# Patient Record
Sex: Female | Born: 1995
Health system: Southern US, Community
[De-identification: ages and names within clinical notes are randomized; demographics above are authoritative.]

## PROBLEM LIST (undated history)

## (undated) DIAGNOSIS — G43909 Migraine, unspecified, not intractable, without status migrainosus: Secondary | ICD-10-CM

## (undated) DIAGNOSIS — L0293 Carbuncle, unspecified: Secondary | ICD-10-CM

## (undated) DIAGNOSIS — L0292 Furuncle, unspecified: Secondary | ICD-10-CM

## (undated) HISTORY — DX: Carbuncle, unspecified: L02.93

## (undated) HISTORY — PX: OTHER SURGICAL HISTORY: SHX169

## (undated) HISTORY — DX: Migraine, unspecified, not intractable, without status migrainosus: G43.909

## (undated) HISTORY — DX: Furuncle, unspecified: L02.92

---

## 2017-09-12 ENCOUNTER — Other Ambulatory Visit: Payer: Self-pay

## 2017-09-12 ENCOUNTER — Emergency Department (HOSPITAL_BASED_OUTPATIENT_CLINIC_OR_DEPARTMENT_OTHER)
Admission: EM | Admit: 2017-09-12 | Discharge: 2017-09-12 | Disposition: A | Discharge status: Home / Self Care | Payer: Self-pay | Attending: Emergency Medicine | Admitting: Emergency Medicine

## 2017-09-12 ENCOUNTER — Encounter (HOSPITAL_BASED_OUTPATIENT_CLINIC_OR_DEPARTMENT_OTHER): Payer: Self-pay | Admitting: *Deleted

## 2017-09-12 DIAGNOSIS — F17200 Nicotine dependence, unspecified, uncomplicated: Secondary | ICD-10-CM | POA: Insufficient documentation

## 2017-09-12 DIAGNOSIS — L02412 Cutaneous abscess of left axilla: Secondary | ICD-10-CM | POA: Insufficient documentation

## 2017-09-12 MED ORDER — LIDOCAINE-EPINEPHRINE (PF) 2 %-1:200000 IJ SOLN
10.0000 mL | Freq: Once | INTRAMUSCULAR | Status: AC
  Administered 2017-09-12: 10 mL
  Filled 2017-09-12 (×2): qty 10

## 2017-09-12 NOTE — Discharge Instructions (Signed)
Please read and follow all provided instructions.  Your diagnoses today include:  1. Abscess of left axilla    Tests performed today include:  Vital signs. See below for your results today.   Medications prescribed:   None  Take any prescribed medications only as directed.   Home care instructions:   Follow any educational materials contained in this packet  Follow-up instructions: Return to the Emergency Department in 48 hours for a recheck if your symptoms are not significantly improved.  Please follow-up with your primary care provider in the next 1 week for further evaluation of your symptoms.   Return instructions:  Return to the Emergency Department if you have:  Fever  Worsening symptoms  Worsening pain  Worsening swelling  Redness of the skin that moves away from the affected area, especially if it streaks away from the affected area   Any other emergent concerns  Your vital signs today were: BP 133/76    Pulse 96    Temp 98.2 F (36.8 C)    Resp 18    Ht 6\' 1"  (1.854 m)    Wt 117.9 kg (260 lb)    LMP 09/06/2017    SpO2 100%    BMI 34.30 kg/m  If your blood pressure (BP) was elevated above 135/85 this visit, please have this repeated by your doctor within one month. --------------

## 2017-09-12 NOTE — ED Provider Notes (Signed)
[facility] EMERGENCY DEPARTMENT Provider Note   CSN: [id number] Arrival date & time: 09/12/17  1305     History   Chief Complaint Chief Complaint  Patient presents with  . Abscess    HPI Colleen Woods is a 22 y.o. female.  Patient presents with complaint of swelling and pain to her left axilla starting approximately 1 week ago.  Patient has had similar symptoms with previous abscesses which have required incision and drainage.  She denies fever, nausea or vomiting.  She has been applying warm compresses without much improvement.  The area has not been draining.  Pain is worse with palpation and movement.  Nothing makes her symptoms better.     History reviewed. No pertinent past medical history.  There are no active problems to display for this patient.   History reviewed. No pertinent surgical history.   OB History   None      Home Medications    Prior to Admission medications   Not on File    Family History History reviewed. No pertinent family history.  Social History Social History   Tobacco Use  . Smoking status: Current Every Day Smoker    Packs/day: 0.50  . Smokeless tobacco: Never Used  Substance Use Topics  . Alcohol use: Not Currently  . Drug use: Not Currently     Allergies   Patient has no known allergies.   Review of Systems Review of Systems  Constitutional: Negative for fever.  Gastrointestinal: Negative for nausea and vomiting.  Skin: Negative for color change.       Positive for abscess.  Hematological: Negative for adenopathy.     Physical Exam Updated Vital Signs BP 133/76   Pulse 96   Temp 98.2 F (36.8 C)   Resp 18   Ht 6\' 1"  (1.854 m)   Wt 117.9 kg (260 lb)   LMP 09/06/2017   SpO2 100%   BMI 34.30 kg/m   Physical Exam  Constitutional: She appears well-developed and well-nourished.  HENT:  Head: Normocephalic and atraumatic.  Eyes: Conjunctivae are normal.  Neck: Normal range of motion. Neck  supple.  Pulmonary/Chest: No respiratory distress.  Neurological: She is alert.  Skin: Skin is warm and dry.  Approximately 3 cm area of induration to the left axilla without surrounding cellulitis.  Area is tender consistent with subcutaneous abscess.  Psychiatric: She has a normal mood and affect.  Nursing note and vitals reviewed.    ED Treatments / Results  Labs (all labs ordered are listed, but only abnormal results are displayed) Labs Reviewed - No data to display  EKG None  Radiology No results found.  Procedures .Marland KitchenIncision and Drainage Date/Time: 09/12/2017 2:32 PM Performed by: Renne Crigler, PA-C Authorized by: Renne Crigler, PA-C   Consent:    Consent obtained:  Verbal   Consent given by:  Patient   Risks discussed:  Bleeding, pain and incomplete drainage   Alternatives discussed:  No treatment Location:    Type:  Abscess   Size:  3cm Pre-procedure details:    Skin preparation:  Hibiclens Anesthesia (see MAR for exact dosages):    Anesthesia method:  Local infiltration   Local anesthetic:  Lidocaine 2% WITH epi Procedure type:    Complexity:  Simple Procedure details:    Needle aspiration: yes     Needle size:  18 G   Incision types:  Stab incision   Incision depth:  Dermal   Scalpel blade:  11   Wound management:  Probed and deloculated   Drainage:  Bloody and purulent   Drainage amount:  Copious   Wound treatment:  Wound left open   Packing materials:  None Post-procedure details:    Patient tolerance of procedure:  Tolerated well, no immediate complications   (including critical care time)  Medications Ordered in ED Medications  lidocaine-EPINEPHrine (XYLOCAINE W/EPI) 2 %-1:200000 (PF) injection 10 mL (has no administration in time range)     Initial Impression / Assessment and Plan / ED Course  I have reviewed the triage vital signs and the nursing notes.  Pertinent labs & imaging results that were available during my care of the  patient were reviewed by me and considered in my medical decision making (see chart for details).     Patient seen and examined.  Discussed I&D procedure with patient and she agrees to proceed.  Medications ordered.   Vital signs reviewed and are as follows: BP 133/76   Pulse 96   Temp 98.2 F (36.8 C)   Resp 18   Ht 6\' 1"  (1.854 m)   Wt 117.9 kg (260 lb)   LMP 09/06/2017   SpO2 100%   BMI 34.30 kg/m   The patient was urged to return to the Emergency Department urgently with worsening pain, swelling, expanding erythema especially if it streaks away from the affected area, fever, or if they have any other concerns.   The patient was urged to return to the Emergency Department or go to their PCP in 48 hours for wound recheck if the area is not significantly improved.  The patient verbalized understanding and stated agreement with this plan.    Final Clinical Impressions(s) / ED Diagnoses   Final diagnoses:  Abscess of left axilla   Patient with skin abscess amenable to incision and drainage. Abscess was drained with good results. No signs of cellulitis is surrounding skin. Will d/c to home. No antibiotic therapy is indicated.   ED Discharge Orders    None       Renne CriglerGeiple, Rossetta Kama, Cordelia Poche-C 09/12/17 1632    Gwyneth SproutPlunkett, Whitney, MD 09/16/17 [phone]

## 2017-09-12 NOTE — ED Triage Notes (Signed)
Pt c/ abscess to left axillary x 1 week

## 2017-12-03 ENCOUNTER — Emergency Department (INDEPENDENT_AMBULATORY_CARE_PROVIDER_SITE_OTHER)
Admission: EM | Admit: 2017-12-03 | Discharge: 2017-12-03 | Disposition: A | Discharge status: Home / Self Care | Payer: BLUE CROSS/BLUE SHIELD | Source: Home / Self Care | Attending: Family Medicine | Admitting: Family Medicine

## 2017-12-03 DIAGNOSIS — K0889 Other specified disorders of teeth and supporting structures: Secondary | ICD-10-CM | POA: Diagnosis not present

## 2017-12-03 MED ORDER — HYDROCODONE-ACETAMINOPHEN 5-325 MG PO TABS: ORAL_TABLET | ORAL | 0 refills | Status: DC

## 2017-12-03 MED ORDER — CLINDAMYCIN HCL 300 MG PO CAPS: ORAL_CAPSULE | ORAL | 0 refills | Status: DC

## 2017-12-03 NOTE — ED Provider Notes (Signed)
[facility]    CSN: [id number] Arrival date & time: 12/03/17  1720     History   Chief Complaint Chief Complaint  Patient presents with  . Dental Pain    HPI Colleen Woods is a 22 y.o. female.   Patient complains of two week history of intermittent pain in a right lower tooth.  She notes that she has a dental appointment in three days.  She recently finished a course of amoxicillin for a respiratory infection without improvement.  The history is provided by the patient.  Dental Pain  Location:  Lower Lower teeth location:  29/RL 2nd bicuspid Quality:  Aching and constant Severity:  Severe Onset quality:  Gradual Duration:  2 weeks Timing:  Constant Progression:  Worsening Chronicity:  New Context: filling intact   Previous work-up:  Filled cavity Relieved by:  Nothing Worsened by:  Touching and pressure (chewing) Ineffective treatments:  NSAIDs Associated symptoms: no congestion, no difficulty swallowing, no drooling, no facial pain, no facial swelling, no fever, no gum swelling, no headaches, no neck pain, no neck swelling, no oral bleeding, no oral lesions and no trismus     History reviewed. No pertinent past medical history.  There are no active problems to display for this patient.   History reviewed. No pertinent surgical history.  OB History   None      Home Medications    Prior to Admission medications   Medication Sig Start Date End Date Taking? Authorizing Provider  ibuprofen (ADVIL,MOTRIN) 200 MG tablet Take 200 mg by mouth every 6 (six) hours as needed.   Yes [provider]  clindamycin (CLEOCIN) 300 MG capsule Take one cap by mouth every 8 hours 12/03/17   Lattie Haw, MD  HYDROcodone-acetaminophen (NORCO/VICODIN) 5-325 MG tablet Take one by mouth at bedtime as needed for pain.  May repeat in 4 to 6 hours if needed 12/03/17   Lattie Haw, MD    Family History History reviewed. No pertinent family  history.  Social History Social History   Tobacco Use  . Smoking status: Current Every Day Smoker    Packs/day: 0.10  . Smokeless tobacco: Never Used  Substance Use Topics  . Alcohol use: Not Currently  . Drug use: Not Currently     Allergies   Patient has no known allergies.   Review of Systems Review of Systems  Constitutional: Negative for fever.  HENT: Negative for congestion, drooling, facial swelling and mouth sores.   Musculoskeletal: Negative for neck pain.  Neurological: Negative for headaches.  All other systems reviewed and are negative.    Physical Exam Triage Vital Signs ED Triage Vitals  Enc Vitals Group     BP 12/03/17 1747 127/84     Pulse Rate 12/03/17 1747 87     Resp 12/03/17 1747 16     Temp 12/03/17 1747 98.3 F (36.8 C)     Temp Source 12/03/17 1747 Oral     SpO2 12/03/17 1747 100 %     Weight 12/03/17 1749 278 lb (126.1 kg)     Height 12/03/17 1749 6\' 1"  (1.854 m)     Head Circumference --      Peak Flow --      Pain Score 12/03/17 1749 10     Pain Loc --      Pain Edu? --      Excl. in GC? --    No data found.  Updated Vital Signs BP 127/84 (BP Location:  Right Arm)   Pulse 87   Temp 98.3 F (36.8 C) (Oral)   Resp 16   Ht 6\' 1"  (1.854 m)   Wt 126.1 kg   SpO2 100%   BMI 36.68 kg/m   Visual Acuity Right Eye Distance:   Left Eye Distance:   Bilateral Distance:    Right Eye Near:   Left Eye Near:    Bilateral Near:     Physical Exam  Constitutional: She appears well-developed and well-nourished. No distress.  HENT:  Head: Normocephalic.  Right Ear: External ear normal.  Left Ear: External ear normal.  Nose: Nose normal.  Mouth/Throat: Uvula is midline, oropharynx is clear and moist and mucous membranes are normal. No trismus in the jaw.    There is tenderness to tap over tooth #29 in right mandible.  Surrounding teeth also mildly tender as well as surrounding gingiva, but no swelling or fluctuance.  Eyes: Pupils are  equal, round, and reactive to light. Conjunctivae are normal.  Neck: Neck supple.  Cardiovascular: Normal rate.  Pulmonary/Chest: Effort normal.  Lymphadenopathy:    She has no cervical adenopathy.  Neurological: She is alert.  Skin: Skin is warm and dry.  Nursing note and vitals reviewed.    UC Treatments / Results  Labs (all labs ordered are listed, but only abnormal results are displayed) Labs Reviewed - No data to display  EKG None  Radiology No results found.  Procedures Procedures (including critical care time)  Medications Ordered in UC Medications - No data to display  Initial Impression / Assessment and Plan / UC Course  I have reviewed the triage vital signs and the nursing notes.  Pertinent labs & imaging results that were available during my care of the patient were reviewed by me and considered in my medical decision making (see chart for details).    Begin Clindamycin. Rx for Lortab at bedtime (#10, no refill). Controlled Substance Prescriptions I have consulted the [place] Controlled Substances Registry for this patient, and feel the risk/benefit ratio today is favorable for proceeding with this prescription for a controlled substance.   Followup with dentist as scheduled in 3 days.   Final Clinical Impressions(s) / UC Diagnoses   Final diagnoses:  Tooth pain     Discharge Instructions     May take Tylenol daytime for pain    ED Prescriptions    Medication Sig Dispense Auth. Provider   clindamycin (CLEOCIN) 300 MG capsule Take one cap by mouth every 8 hours 21 capsule Lattie Haw, MD   HYDROcodone-acetaminophen (NORCO/VICODIN) 5-325 MG tablet Take one by mouth at bedtime as needed for pain.  May repeat in 4 to 6 hours if needed 10 tablet Cathren Harsh Tera Mater, MD        Lattie Haw, MD 12/04/17 [phone]

## 2017-12-03 NOTE — Discharge Instructions (Addendum)
May take Tylenol daytime for pain. 

## 2017-12-03 NOTE — ED Triage Notes (Signed)
Colleen Woods complains of tooth pain off and on for the last couple weeks. The pain was worse last night, about a 10/10.

## 2018-04-24 ENCOUNTER — Encounter (HOSPITAL_BASED_OUTPATIENT_CLINIC_OR_DEPARTMENT_OTHER): Payer: Self-pay | Admitting: Emergency Medicine

## 2018-04-24 ENCOUNTER — Emergency Department (HOSPITAL_BASED_OUTPATIENT_CLINIC_OR_DEPARTMENT_OTHER)
Admission: EM | Admit: 2018-04-24 | Discharge: 2018-04-24 | Disposition: A | Discharge status: Home / Self Care | Payer: BLUE CROSS/BLUE SHIELD | Attending: Emergency Medicine | Admitting: Emergency Medicine

## 2018-04-24 ENCOUNTER — Emergency Department (HOSPITAL_BASED_OUTPATIENT_CLINIC_OR_DEPARTMENT_OTHER): Payer: BLUE CROSS/BLUE SHIELD

## 2018-04-24 ENCOUNTER — Other Ambulatory Visit: Payer: Self-pay

## 2018-04-24 DIAGNOSIS — B9789 Other viral agents as the cause of diseases classified elsewhere: Secondary | ICD-10-CM

## 2018-04-24 DIAGNOSIS — Z79899 Other long term (current) drug therapy: Secondary | ICD-10-CM | POA: Insufficient documentation

## 2018-04-24 DIAGNOSIS — L720 Epidermal cyst: Secondary | ICD-10-CM | POA: Diagnosis not present

## 2018-04-24 DIAGNOSIS — J069 Acute upper respiratory infection, unspecified: Secondary | ICD-10-CM | POA: Insufficient documentation

## 2018-04-24 DIAGNOSIS — F17210 Nicotine dependence, cigarettes, uncomplicated: Secondary | ICD-10-CM | POA: Insufficient documentation

## 2018-04-24 LAB — INFLUENZA PANEL BY PCR (TYPE A & B)
INFLBPCR: NEGATIVE
Influenza A By PCR: NEGATIVE

## 2018-04-24 MED ORDER — LIDOCAINE-EPINEPHRINE (PF) 2 %-1:200000 IJ SOLN
10.0000 mL | Freq: Once | INTRAMUSCULAR | Status: AC
  Administered 2018-04-24: 10 mL
  Filled 2018-04-24 (×2): qty 10

## 2018-04-24 NOTE — ED Triage Notes (Signed)
Reports cough, congestion, and generalized body aches since Thursday.

## 2018-04-24 NOTE — ED Provider Notes (Signed)
[facility] EMERGENCY DEPARTMENT Provider Note   CSN: [id number] Arrival date & time: 04/24/18  1423    History   Chief Complaint Chief Complaint  Patient presents with  . Cough    HPI Cedria Stilling is a 23 y.o. female.     HPI Patient is a 23 year old female who presents the emergency department with generalized aches cough congestion with low-grade fever at home.  Symptoms are mild to moderate in severity.  No sinus tenderness.  No dysuria or urinary frequency.  Denies nausea vomiting diarrhea.  Symptoms are mild to moderate in severity.  Patient also with upper back epidermoid cyst present for many years, now with ongoing discomfort.    History reviewed. No pertinent past medical history.  There are no active problems to display for this patient.   History reviewed. No pertinent surgical history.   OB History   No obstetric history on file.      Home Medications    Prior to Admission medications   Medication Sig Start Date End Date Taking? Authorizing Provider  clindamycin (CLEOCIN) 300 MG capsule Take one cap by mouth every 8 hours 12/03/17   Lattie Haw, MD  HYDROcodone-acetaminophen (NORCO/VICODIN) 5-325 MG tablet Take one by mouth at bedtime as needed for pain.  May repeat in 4 to 6 hours if needed 12/03/17   Lattie Haw, MD  ibuprofen (ADVIL,MOTRIN) 200 MG tablet Take 200 mg by mouth every 6 (six) hours as needed.    [provider]    Family History History reviewed. No pertinent family history.  Social History Social History   Tobacco Use  . Smoking status: Current Every Day Smoker    Packs/day: 0.10  . Smokeless tobacco: Never Used  Substance Use Topics  . Alcohol use: Not Currently  . Drug use: Not Currently     Allergies   Patient has no known allergies.   Review of Systems Review of Systems  All other systems reviewed and are negative.    Physical Exam Updated Vital Signs BP 123/70 (BP Location:  Right Arm)   Pulse (!) 103   Temp 98.1 F (36.7 C) (Oral)   Resp 18   Ht 6\' 1"  (1.854 m)   Wt 122.5 kg   LMP 04/10/2018 (Approximate)   SpO2 100%   BMI 35.62 kg/m   Physical Exam Vitals signs and nursing note reviewed.  Constitutional:      General: She is not in acute distress.    Appearance: She is well-developed.  HENT:     Head: Normocephalic and atraumatic.  Neck:     Musculoskeletal: Normal range of motion.  Cardiovascular:     Rate and Rhythm: Normal rate and regular rhythm.     Heart sounds: Normal heart sounds.  Pulmonary:     Effort: Pulmonary effort is normal.     Breath sounds: Normal breath sounds.  Abdominal:     General: There is no distension.     Palpations: Abdomen is soft.     Tenderness: There is no abdominal tenderness.  Musculoskeletal: Normal range of motion.  Skin:    General: Skin is warm and dry.  Neurological:     Mental Status: She is alert and oriented to person, place, and time.  Psychiatric:        Judgment: Judgment normal.      ED Treatments / Results  Labs (all labs ordered are listed, but only abnormal results are displayed) Labs Reviewed  INFLUENZA PANEL BY  PCR (TYPE A & B)    EKG None  Radiology Dg Chest 2 View  Result Date: 04/24/2018 CLINICAL DATA:  Cough and congestion for 3 days. EXAM: CHEST - 2 VIEW COMPARISON:  None. FINDINGS: Lungs clear. Heart size normal. No pneumothorax or pleural fluid. No acute or focal bony abnormality. IMPRESSION: Negative chest. Electronically Signed   By: Drusilla Kanner M.D.   On: 04/24/2018 10:16    Procedures .Marland KitchenIncision and Drainage Date/Time: 04/24/2018 10:56 AM Performed by: Azalia Bilis, MD Authorized by: Azalia Bilis, MD    INCISION AND DRAINAGE Performed by: Azalia Bilis Consent: Verbal consent obtained. Risks and benefits: risks, benefits and alternatives were discussed Time out performed prior to procedure Type: Epidermoid Cyst  Body area: upper back Anesthesia:  local infiltration Incision was made with a scalpel. Local anesthetic: lidocaine 2% with epinephrine Anesthetic total: 7 ml Complexity: complex Blunt dissection to break up loculations Drainage: Keratin Drainage amount: large Packing material: none Patient tolerance: Patient tolerated the procedure well with no immediate complications.     Medications Ordered in ED Medications  lidocaine-EPINEPHrine (XYLOCAINE W/EPI) 2 %-1:200000 (PF) injection 10 mL (10 mLs Infiltration Given by Other 04/24/18 1016)     Initial Impression / Assessment and Plan / ED Course  I have reviewed the triage vital signs and the nursing notes.  Pertinent labs & imaging results that were available during my care of the patient were reviewed by me and considered in my medical decision making (see chart for details).        Well-appearing.  Stable for discharge.  I will follow-up on the patient's influenza and inform her of a positive test.  Stable for discharge from the emergency department.  Incision and drainage of epidermoid cyst with large amount of keratin tear removed  Final Clinical Impressions(s) / ED Diagnoses   Final diagnoses:  None    ED Discharge Orders    None       Azalia Bilis, MD 04/24/18 1057

## 2018-06-07 DIAGNOSIS — L0291 Cutaneous abscess, unspecified: Secondary | ICD-10-CM | POA: Diagnosis not present

## 2018-06-07 DIAGNOSIS — L732 Hidradenitis suppurativa: Secondary | ICD-10-CM | POA: Diagnosis not present

## 2018-08-20 ENCOUNTER — Encounter (HOSPITAL_BASED_OUTPATIENT_CLINIC_OR_DEPARTMENT_OTHER): Payer: Self-pay | Admitting: Emergency Medicine

## 2018-08-20 ENCOUNTER — Emergency Department (HOSPITAL_BASED_OUTPATIENT_CLINIC_OR_DEPARTMENT_OTHER)
Admission: EM | Admit: 2018-08-20 | Discharge: 2018-08-20 | Disposition: A | Discharge status: Home / Self Care | Payer: BC Managed Care – PPO | Attending: Emergency Medicine | Admitting: Emergency Medicine

## 2018-08-20 ENCOUNTER — Other Ambulatory Visit: Payer: Self-pay

## 2018-08-20 DIAGNOSIS — F17200 Nicotine dependence, unspecified, uncomplicated: Secondary | ICD-10-CM | POA: Diagnosis not present

## 2018-08-20 DIAGNOSIS — L02412 Cutaneous abscess of left axilla: Secondary | ICD-10-CM | POA: Insufficient documentation

## 2018-08-20 DIAGNOSIS — R51 Headache: Secondary | ICD-10-CM | POA: Insufficient documentation

## 2018-08-20 DIAGNOSIS — G44209 Tension-type headache, unspecified, not intractable: Secondary | ICD-10-CM

## 2018-08-20 DIAGNOSIS — L0291 Cutaneous abscess, unspecified: Secondary | ICD-10-CM

## 2018-08-20 MED ORDER — LIDOCAINE-EPINEPHRINE (PF) 2 %-1:200000 IJ SOLN
10.0000 mL | Freq: Once | INTRAMUSCULAR | Status: AC
  Administered 2018-08-20: 10 mL
  Filled 2018-08-20 (×2): qty 10

## 2018-08-20 MED ORDER — KETOROLAC TROMETHAMINE 60 MG/2ML IM SOLN
15.0000 mg | Freq: Once | INTRAMUSCULAR | Status: AC
  Administered 2018-08-20: 15 mg via INTRAMUSCULAR
  Filled 2018-08-20: qty 2

## 2018-08-20 MED ORDER — SULFAMETHOXAZOLE-TRIMETHOPRIM 800-160 MG PO TABS: 1.0000 | ORAL_TABLET | Freq: Two times a day (BID) | ORAL | 0 refills | Status: AC

## 2018-08-20 MED ORDER — SULFAMETHOXAZOLE-TRIMETHOPRIM 800-160 MG PO TABS: 1.0000 | ORAL_TABLET | Freq: Two times a day (BID) | ORAL | 0 refills | Status: DC

## 2018-08-20 NOTE — ED Notes (Signed)
Pt verbalized understanding of dc instructions.

## 2018-08-20 NOTE — ED Triage Notes (Signed)
Abscess to L axilla and headache x 1 week.

## 2018-08-20 NOTE — ED Provider Notes (Signed)
[facility] EMERGENCY DEPARTMENT Provider Note   CSN: [id number] Arrival date & time: 08/20/18  1426     History   Chief Complaint Chief Complaint  Patient presents with  . Abscess  . Headache    HPI Colleen Woods is a 23 y.o. female.     Patient presents to the ED with complaint of left axillary abscess ongoing for several days.  She has had a history of the same.  She has had multiple incision and drainages in the past.  She denies any fever, chest pain, shortness of breath.  In addition she has had a frontal headache over the past 1 week.  No vomiting.  No weakness, numbness, or tingling in the arms of the legs.  She is walking normally.  No light or sound sensitivity.  She has had similar headaches in the past.  She has taken ibuprofen without relief.  Headaches waxing and waning.  No preceding head injury or neck pain.     History reviewed. No pertinent past medical history.  There are no active problems to display for this patient.   History reviewed. No pertinent surgical history.   OB History   No obstetric history on file.      Home Medications    Prior to Admission medications   Medication Sig Start Date End Date Taking? Authorizing Provider  clindamycin (CLEOCIN) 300 MG capsule Take one cap by mouth every 8 hours 12/03/17   Lattie HawBeese, Stephen A, MD  HYDROcodone-acetaminophen (NORCO/VICODIN) 5-325 MG tablet Take one by mouth at bedtime as needed for pain.  May repeat in 4 to 6 hours if needed 12/03/17   Lattie HawBeese, Stephen A, MD  ibuprofen (ADVIL,MOTRIN) 200 MG tablet Take 200 mg by mouth every 6 (six) hours as needed.    [provider]  sulfamethoxazole-trimethoprim (BACTRIM DS) 800-160 MG tablet Take 1 tablet by mouth 2 (two) times daily for 7 days. 08/20/18 08/27/18  Renne CriglerGeiple, Ajahni Nay, PA-C    Family History No family history on file.  Social History Social History   Tobacco Use  . Smoking status: Current Every Day Smoker    Packs/day:  0.10  . Smokeless tobacco: Never Used  Substance Use Topics  . Alcohol use: Not Currently  . Drug use: Not Currently     Allergies   Patient has no known allergies.   Review of Systems Review of Systems  Constitutional: Negative for fever.  HENT: Negative for congestion, dental problem, rhinorrhea and sinus pressure.   Eyes: Negative for photophobia, discharge, redness and visual disturbance.  Respiratory: Negative for shortness of breath.   Cardiovascular: Negative for chest pain.  Gastrointestinal: Negative for nausea and vomiting.  Musculoskeletal: Negative for gait problem, neck pain and neck stiffness.  Skin: Positive for color change. Negative for rash.       Positive for abscess.  Neurological: Positive for headaches. Negative for syncope, speech difficulty, weakness, light-headedness and numbness.  Hematological: Negative for adenopathy.  Psychiatric/Behavioral: Negative for confusion.     Physical Exam Updated Vital Signs BP (!) 108/96 (BP Location: Right Arm)   Pulse 95   Temp 98.9 F (37.2 C) (Oral)   Resp 18   Ht 6\' 1"  (1.854 m)   Wt 136.1 kg   LMP 08/15/2018   SpO2 100%   BMI 39.58 kg/m   Physical Exam Vitals signs and nursing note reviewed.  Constitutional:      Appearance: She is well-developed.  HENT:     Head: Normocephalic and atraumatic.  Right Ear: Tympanic membrane, ear canal and external ear normal.     Left Ear: Tympanic membrane, ear canal and external ear normal.     Nose: Nose normal.     Mouth/Throat:     Pharynx: Uvula midline.  Eyes:     General: Lids are normal.        Right eye: No discharge.        Left eye: No discharge.     Extraocular Movements:     Right eye: No nystagmus.     Left eye: No nystagmus.     Conjunctiva/sclera: Conjunctivae normal.     Pupils: Pupils are equal, round, and reactive to light.  Neck:     Musculoskeletal: Normal range of motion and neck supple.  Cardiovascular:     Rate and Rhythm:  Normal rate and regular rhythm.     Heart sounds: Normal heart sounds.  Pulmonary:     Effort: Pulmonary effort is normal.     Breath sounds: Normal breath sounds.  Abdominal:     Palpations: Abdomen is soft.     Tenderness: There is no abdominal tenderness.  Musculoskeletal:     Cervical back: She exhibits normal range of motion, no tenderness and no bony tenderness.  Skin:    General: Skin is warm and dry.     Comments: 3 cm area of induration in the left axillary area, anteriorly, consistent with abscess.  She has 1 or 2 cm of erythema consistent with cellulitis on the chest wall.  Neurological:     Mental Status: She is alert and oriented to person, place, and time.     GCS: GCS eye subscore is 4. GCS verbal subscore is 5. GCS motor subscore is 6.     Cranial Nerves: No cranial nerve deficit.     Sensory: No sensory deficit.     Coordination: Coordination normal.     Gait: Gait normal.     Deep Tendon Reflexes: Reflexes are normal and symmetric.      ED Treatments / Results  Labs (all labs ordered are listed, but only abnormal results are displayed) Labs Reviewed - No data to display  EKG None  Radiology No results found.  Procedures .Marland KitchenIncision and Drainage  Date/Time: 08/20/2018 4:49 PM Performed by: Carlisle Cater, PA-C Authorized by: Carlisle Cater, PA-C   Consent:    Consent obtained:  Verbal   Consent given by:  Patient   Risks discussed:  Pain and bleeding   Alternatives discussed:  No treatment Location:    Type:  Abscess   Size:  3cm   Location: L axilla. Pre-procedure details:    Skin preparation:  Betadine Anesthesia (see MAR for exact dosages):    Anesthesia method:  Local infiltration   Local anesthetic:  Lidocaine 2% WITH epi Procedure type:    Complexity:  Simple Procedure details:    Incision types:  Stab incision   Scalpel blade:  11   Wound management:  Probed and deloculated   Drainage:  Purulent   Drainage amount:  Copious   Wound  treatment:  Drain placed   Packing materials:  1/4 in iodoform gauze Post-procedure details:    Patient tolerance of procedure:  Tolerated well, no immediate complications   (including critical care time)  Medications Ordered in ED Medications  lidocaine-EPINEPHrine (XYLOCAINE W/EPI) 2 %-1:200000 (PF) injection 10 mL (10 mLs Infiltration Given 08/20/18 1615)  ketorolac (TORADOL) injection 15 mg (15 mg Intramuscular Given 08/20/18 1615)  Initial Impression / Assessment and Plan / ED Course  I have reviewed the triage vital signs and the nursing notes.  Pertinent labs & imaging results that were available during my care of the patient were reviewed by me and considered in my medical decision making (see chart for details).        Patient seen and examined.  She agrees to proceed with incision and drainage.  Will give Toradol IM for headache.  Vital signs reviewed and are as follows: BP (!) 108/96 (BP Location: Right Arm)   Pulse 95   Temp 98.9 F (37.2 C) (Oral)   Resp 18   Ht 6\' 1"  (1.854 m)   Wt 136.1 kg   LMP 08/15/2018   SpO2 100%   BMI 39.58 kg/m   4:51 PM Rx Bactrim. The patient was urged to return to the Emergency Department urgently with worsening pain, swelling, expanding erythema especially if it streaks away from the affected area, fever, or if they have any other concerns.   The patient was instructed to remove packing at home in 48 hours and return to the Emergency Department or go to their PCP at that time for a recheck if their symptoms are not entirely resolved.  The patient verbalized understanding and stated agreement with this plan.    Final Clinical Impressions(s) / ED Diagnoses   Final diagnoses:  Abscess  Acute non intractable tension-type headache   Abscess: I&D performed without complicating factors.  Patient will be discharged home on Bactrim.  Headache: Treated with Toradol. Patient without high-risk features of headache including: sudden  onset/thunderclap HA, no similar headache in past, altered mental status, accompanying seizure, headache with exertion, age > 150, history of immunocompromise, neck or shoulder pain, fever, use of anticoagulation, family history of spontaneous SAH, concomitant drug use, toxic exposure.   Patient has a normal complete neurological exam, normal vital signs, normal level of consciousness, no signs of meningismus, is well-appearing/non-toxic appearing, no signs of trauma.   Imaging with CT/MRI not indicated given history and physical exam findings.   No dangerous or life-threatening conditions suspected or identified by history, physical exam, and by work-up. No indications for hospitalization identified.    ED Discharge Orders         Ordered    sulfamethoxazole-trimethoprim (BACTRIM DS) 800-160 MG tablet  2 times daily,   Status:  Discontinued     08/20/18 1643    sulfamethoxazole-trimethoprim (BACTRIM DS) 800-160 MG tablet  2 times daily     08/20/18 1647           Renne CriglerGeiple, Shabazz Mckey, PA-C 08/20/18 1651    Curatolo, Adam, DO 08/20/18 2340

## 2018-08-20 NOTE — Discharge Instructions (Signed)
Please read and follow all provided instructions.  Your diagnoses today include:  1. Abscess   2. Acute non intractable tension-type headache     Tests performed today include:  Vital signs. See below for your results today.   Medications prescribed:   Bactrim (trimethoprim/sulfamethoxazole) - antibiotic  You have been prescribed an antibiotic medicine: take the entire course of medicine even if you are feeling better. Stopping early can cause the antibiotic not to work.  Take any prescribed medications only as directed.   Home care instructions:   Follow any educational materials contained in this packet  Follow-up instructions: Gently pull the packing out in the next 48 hours at home.  Return to the Emergency Department in 48 hours for a recheck if your symptoms are not significantly improved.  Return instructions:  Return to the Emergency Department if you have:  Fever  Worsening symptoms  Worsening pain  Worsening swelling  Redness of the skin that moves away from the affected area, especially if it streaks away from the affected area   Any other emergent concerns  Your vital signs today were: BP (!) 108/96 (BP Location: Right Arm)    Pulse 95    Temp 98.9 F (37.2 C) (Oral)    Resp 18    Ht 6\' 1"  (1.854 m)    Wt 136.1 kg    LMP 08/15/2018    SpO2 100%    BMI 39.58 kg/m  If your blood pressure (BP) was elevated above 135/85 this visit, please have this repeated by your doctor within one month. --------------

## 2018-08-21 MED FILL — SULFAMETHOXAZOLE-TMP DS TAB: 800-160 | 7 days supply | Qty: 14 | Fill #0

## 2018-10-24 ENCOUNTER — Ambulatory Visit: Payer: BC Managed Care – PPO | Admitting: Family

## 2018-10-24 ENCOUNTER — Encounter: Payer: Self-pay | Admitting: Family

## 2018-10-24 ENCOUNTER — Other Ambulatory Visit: Payer: Self-pay

## 2018-10-24 VITALS — BP 118/62 | HR 92 | Temp 98.0°F | Resp 16 | Ht 73.0 in | Wt 298.0 lb

## 2018-10-24 DIAGNOSIS — N62 Hypertrophy of breast: Secondary | ICD-10-CM | POA: Diagnosis not present

## 2018-10-24 DIAGNOSIS — G43909 Migraine, unspecified, not intractable, without status migrainosus: Secondary | ICD-10-CM

## 2018-10-24 DIAGNOSIS — M546 Pain in thoracic spine: Secondary | ICD-10-CM

## 2018-10-24 DIAGNOSIS — G8929 Other chronic pain: Secondary | ICD-10-CM | POA: Diagnosis not present

## 2018-10-24 MED ORDER — NORTRIPTYLINE HCL 10 MG PO CAPS: 10.0000 mg | ORAL_CAPSULE | Freq: Every day | ORAL | 2 refills | Status: AC

## 2018-10-24 MED ORDER — SUMATRIPTAN SUCCINATE 50 MG PO TABS: 50.0000 mg | ORAL_TABLET | ORAL | 2 refills | Status: AC | PRN

## 2018-10-24 MED ORDER — MELOXICAM 7.5 MG PO TABS: 7.5000 mg | ORAL_TABLET | Freq: Every day | ORAL | 0 refills | Status: AC

## 2018-10-24 NOTE — Progress Notes (Signed)
Subjective:    Patient ID: Colleen Woods, female    DOB: March 11, 1995, 23 y.o.   MRN: [id number]  HPI  Headaches- reports that when she was in middle school she would have daily headaches.  She reports that she has HA's daily.  Uses ibuprofen/tylenol but "nothing helps."  Reports + light/noise sensitive. Denies nausea or aura. Reports she had a scan "years ago"  Which was reportedly normal.   Back pain- mid back.  Attributes to heavy breasts. Seems to be worsening as she has been getting older.   Smokes 2-3 cigarettes/day, ("blacks")  Review of Systems  Constitutional: Negative for unexpected weight change.  HENT: Negative for hearing loss and rhinorrhea.   Eyes: Negative for visual disturbance.  Respiratory: Negative for cough and shortness of breath.   Cardiovascular: Negative for leg swelling.  Gastrointestinal: Negative for diarrhea, nausea and vomiting.  Genitourinary: Negative for dysuria and frequency.  Musculoskeletal: Positive for back pain. Negative for arthralgias and myalgias.  Skin: Negative for rash.  Neurological: Positive for headaches.  Hematological: Negative for adenopathy.  Psychiatric/Behavioral:       Denies depression/anxiety   Past Medical History:  Diagnosis Date  . Migraines   . Recurrent boils      Social History   Socioeconomic History  . Marital status: Single    Spouse name: Not on file  . Number of children: Not on file  . Years of education: Not on file  . Highest education level: Not on file  Occupational History  . 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 Financial resource strain: Not hard at all  . Food insecurity    Worry: Never true    Inability: Never true  . Transportation needs    Medical: No    Non-medical: No  Tobacco Use  . Smoking status: Current Every Day Smoker    Packs/day: 0.10    Types: Cigarettes, Cigars  . Smokeless tobacco: Never Used  Substance and Sexual Activity  . Alcohol use: Not  Currently  . Drug use: Not Currently  . Sexual activity: Yes    Partners: Female    Birth control/protection: None  Lifestyle  . Physical activity    Days per week: 0 days    Minutes per session: Not on file  . Stress: Not on file  Relationships  . Social connections    Talks on phone: More than three times a week    Gets together: Never    Attends religious service: Never    Active member of club or organization: No    Attends meetings of clubs or organizations: Never    Relationship status: Not on file  . Intimate partner violence    Fear of current or ex partner: No    Emotionally abused: No    Physically abused: No    Forced sexual activity: No  Other Topics Concern  . 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 incision and drainage     of recurrent boils    Family History  Problem Relation Age of Onset  . Hyperlipidemia Mother   . Hyperlipidemia Maternal Grandmother  No Known Allergies  No current outpatient medications on file prior to visit.   No current facility-administered medications on file prior to visit.     BP 118/62 (BP Location: Right Arm, Patient Position: Sitting, Cuff Size: Large)   Pulse 92   Temp 98 F (36.7 C) (Temporal)   Resp 16   Ht 6\' 1"  (1.854 m)   Wt 298 lb (135.2 kg)   SpO2 100%   BMI 39.32 kg/m       Objective:   Physical Exam Constitutional:      Appearance: She is well-developed.  Eyes:     Extraocular Movements: Extraocular movements intact.     Pupils: Pupils are equal, round, and reactive to light.  Neck:     Musculoskeletal: Neck supple.     Thyroid: No thyromegaly.  Cardiovascular:     Rate and Rhythm: Normal rate and regular rhythm.     Heart  sounds: Normal heart sounds. No murmur.  Pulmonary:     Effort: Pulmonary effort is normal. No respiratory distress.     Breath sounds: Normal breath sounds. No wheezing.  Musculoskeletal:     Cervical back: She exhibits no tenderness.     Thoracic back: She exhibits no tenderness.     Lumbar back: She exhibits no tenderness.  Skin:    General: Skin is warm and dry.  Neurological:     Mental Status: She is alert and oriented to person, place, and time.     Cranial Nerves: Facial asymmetry present.     Motor: No abnormal muscle tone.  Psychiatric:        Behavior: Behavior normal.        Thought Content: Thought content normal.        Judgment: Judgment normal.           Assessment & Plan:  Back pain- trial of meloxicam, recommended back exercises.  Macromastia- requests referral for breast reduction. Referral placed.   Migraines- trial of pamelor and prn imitrex

## 2018-10-24 NOTE — Patient Instructions (Signed)
Please begin nortriptyline once daily at night for migraine prevention. You may use imitrex as needed for migraine.  Begin meloxicam for back pain. Work on weight loss and quitting smoking.  Welcome to [facility]!

## 2018-10-27 ENCOUNTER — Encounter: Payer: Self-pay | Admitting: Family

## 2018-10-27 ENCOUNTER — Ambulatory Visit: Payer: BC Managed Care – PPO | Admitting: Family

## 2018-10-27 ENCOUNTER — Other Ambulatory Visit: Payer: Self-pay

## 2018-10-27 VITALS — BP 115/73 | HR 99 | Temp 98.3°F | Resp 16

## 2018-10-27 DIAGNOSIS — L02213 Cutaneous abscess of chest wall: Secondary | ICD-10-CM | POA: Diagnosis not present

## 2018-10-27 MED ORDER — DOXYCYCLINE HYCLATE 100 MG PO TABS: 100.0000 mg | ORAL_TABLET | Freq: Two times a day (BID) | ORAL | 0 refills | Status: DC

## 2018-10-27 NOTE — Patient Instructions (Signed)
Please begin doxycycline (antibiotic) for skin infection. Call if increased pain/swelling or redness.

## 2018-10-27 NOTE — Progress Notes (Addendum)
Subjective:    Patient ID: Colleen Woods, female    DOB: 09/29/1995, 23 y.o.   MRN: [id number]  HPI  Patient is a 23 yr old female who presents today with chief complaint of abscess of the right breast.  She reports that abscess has been present x 1 week and is tender. Reports similar lesions in the past in her axilla but never on the breast area.   Review of Systems    see HPI  Past Medical History:  Diagnosis Date  . Migraines   . Recurrent boils      Social History   Socioeconomic History  . Marital status: Single    Spouse name: Not on file  . Number of children: Not on file  . Years of education: Not on file  . Highest education level: Not on file  Occupational History  . 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 Financial resource strain: Not hard at all  . Food insecurity    Worry: Never true    Inability: Never true  . Transportation needs    Medical: No    Non-medical: No  Tobacco Use  . Smoking status: Current Every Day Smoker    Packs/day: 0.10    Types: Cigarettes, Cigars  . Smokeless tobacco: Never Used  Substance and Sexual Activity  . Alcohol use: Not Currently  . Drug use: Not Currently  . Sexual activity: Yes    Partners: Female    Birth control/protection: None  Lifestyle  . Physical activity    Days per week: 0 days    Minutes per session: Not on file  . Stress: Not on file  Relationships  . Social connections    Talks on phone: More than three times a week    Gets together: Never    Attends religious service: Never    Active member of club or organization: No    Attends meetings of clubs or organizations: Never    Relationship status: Not on file  . Intimate partner violence    Fear of current or ex partner: No    Emotionally abused: No    Physically abused: No    Forced sexual activity: No  Other Topics Concern  . 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 incision and drainage     of recurrent boils    Family History  Problem Relation Age of Onset  . Hyperlipidemia Mother   . Hyperlipidemia Maternal Grandmother     No Known Allergies  Current Outpatient Medications on File Prior to Visit  Medication Sig Dispense Refill  . meloxicam (MOBIC) 7.5 MG tablet Take 1 tablet (7.5 mg total) by mouth daily. For back pain 14 tablet 0  . nortriptyline (PAMELOR) 10 MG capsule Take 1 capsule (10 mg total) by mouth at bedtime. 30 capsule 2  . SUMAtriptan (IMITREX) 50 MG tablet Take 1 tablet (50 mg total) by mouth every 2 (two) hours as needed for migraine (max 2 tabs/24 hrs). May repeat in 2 hours if headache persists or recurs. 10 tablet 2   No current facility-administered medications on file prior to visit.  BP 115/73 (BP Location: Right Arm, Patient Position: Sitting, Cuff Size: Large)   Pulse 99   Temp 98.3 F (36.8 C) (Temporal)   Resp 16   SpO2 100%    Objective:   Physical Exam Constitutional:      Appearance: Normal appearance. She is obese.  Skin:         Comments: Approximately 2 cm by 1 cm wide area of fluctuance on right breast near sternum with surrounding induration/tenderness and erythema   Neurological:     Mental Status: She is alert.  Psychiatric:        Mood and Affect: Mood normal.        Behavior: Behavior normal.        Thought Content: Thought content normal.           Assessment & Plan:  Skin abscess- Procedure including risks/benefits explained to patient.  Questions were answered. After informed consent was obtained and a time out completed, site was cleansed with betadine. Approximately 1 cc of 1% Lidocaine with epinephrine was infiltrated into abscess and surrounding tissue. I and D  was performed with sterile scalpel and purulent drainage was expressed from the abscess. No indication for packing today.  Pt instructed to contact us if she develops increased pain or redness at the site.  Pt may use tylenol as needed for discomfort today.

## 2018-11-07 ENCOUNTER — Ambulatory Visit: Payer: BC Managed Care – PPO | Admitting: Family

## 2018-11-07 DIAGNOSIS — Z0289 Encounter for other administrative examinations: Secondary | ICD-10-CM

## 2018-12-01 ENCOUNTER — Encounter: Payer: BC Managed Care – PPO | Admitting: Family

## 2018-12-26 ENCOUNTER — Institutional Professional Consult (permissible substitution): Payer: BC Managed Care – PPO | Admitting: Plastic Surgery

## 2018-12-28 ENCOUNTER — Other Ambulatory Visit: Payer: Self-pay

## 2018-12-29 ENCOUNTER — Encounter: Payer: BC Managed Care – PPO | Admitting: Family

## 2018-12-29 ENCOUNTER — Ambulatory Visit (INDEPENDENT_AMBULATORY_CARE_PROVIDER_SITE_OTHER): Payer: BC Managed Care – PPO | Admitting: Family

## 2018-12-29 ENCOUNTER — Encounter: Payer: Self-pay | Admitting: Family

## 2018-12-29 VITALS — BP 118/68 | HR 89 | Temp 97.2°F | Resp 16 | Ht 73.0 in | Wt 294.0 lb

## 2018-12-29 DIAGNOSIS — Z23 Encounter for immunization: Secondary | ICD-10-CM

## 2018-12-29 DIAGNOSIS — Z0000 Encounter for general adult medical examination without abnormal findings: Secondary | ICD-10-CM

## 2018-12-29 NOTE — Patient Instructions (Addendum)
Please complete lab work prior to leaving.  Preventive Care 21-23 Years Old, Female Preventive care refers to visits with your health care provider and lifestyle choices that can promote health and wellness. This includes:  A yearly physical exam. This may also be called an annual well check.  Regular dental visits and eye exams.  Immunizations.  Screening for certain conditions.  Healthy lifestyle choices, such as eating a healthy diet, getting regular exercise, not using drugs or products that contain nicotine and tobacco, and limiting alcohol use. What can I expect for my preventive care visit? Physical exam Your health care provider will check your:  Height and weight. This may be used to calculate body mass index (BMI), which tells if you are at a healthy weight.  Heart rate and blood pressure.  Skin for abnormal spots. Counseling Your health care provider may ask you questions about your:  Alcohol, tobacco, and drug use.  Emotional well-being.  Home and relationship well-being.  Sexual activity.  Eating habits.  Work and work environment.  Method of birth control.  Menstrual cycle.  Pregnancy history. What immunizations do I need?  Influenza (flu) vaccine  This is recommended every year. Tetanus, diphtheria, and pertussis (Tdap) vaccine  You may need a Td booster every 10 years. Varicella (chickenpox) vaccine  You may need this if you have not been vaccinated. Human papillomavirus (HPV) vaccine  If recommended by your health care provider, you may need three doses over 6 months. Measles, mumps, and rubella (MMR) vaccine  You may need at least one dose of MMR. You may also need a second dose. Meningococcal conjugate (MenACWY) vaccine  One dose is recommended if you are age 19-21 years and a first-year college student living in a residence hall, or if you have one of several medical conditions. You may also need additional booster doses. Pneumococcal  conjugate (PCV13) vaccine  You may need this if you have certain conditions and were not previously vaccinated. Pneumococcal polysaccharide (PPSV23) vaccine  You may need one or two doses if you smoke cigarettes or if you have certain conditions. Hepatitis A vaccine  You may need this if you have certain conditions or if you travel or work in places where you may be exposed to hepatitis A. Hepatitis B vaccine  You may need this if you have certain conditions or if you travel or work in places where you may be exposed to hepatitis B. Haemophilus influenzae type b (Hib) vaccine  You may need this if you have certain conditions. You may receive vaccines as individual doses or as more than one vaccine together in one shot (combination vaccines). Talk with your health care provider about the risks and benefits of combination vaccines. What tests do I need?  Blood tests  Lipid and cholesterol levels. These may be checked every 5 years starting at age 20.  Hepatitis C test.  Hepatitis B test. Screening  Diabetes screening. This is done by checking your blood sugar (glucose) after you have not eaten for a while (fasting).  Sexually transmitted disease (STD) testing.  BRCA-related cancer screening. This may be done if you have a family history of breast, ovarian, tubal, or peritoneal cancers.  Pelvic exam and Pap test. This may be done every 3 years starting at age 21. Starting at age 30, this may be done every 5 years if you have a Pap test in combination with an HPV test. Talk with your health care provider about your test results, treatment options,   treatment options, and if necessary, the need for more tests. Follow these instructions at home: Eating and drinking   Eat a diet that includes fresh fruits and vegetables, whole grains, lean protein, and low-fat dairy.  Take vitamin and mineral supplements as recommended by your health care provider.  Do not drink alcohol  if: ? Your health care provider tells you not to drink. ? You are pregnant, may be pregnant, or are planning to become pregnant.  If you drink alcohol: ? Limit how much you have to 0-1 drink a day. ? Be aware of how much alcohol is in your drink. In the [place] one drink equals one 12 oz bottle of beer (355 mL), one 5 oz glass of wine (148 mL), or one 1 oz glass of hard liquor (44 mL). Lifestyle  Take daily care of your teeth and gums.  Stay active. Exercise for at least 30 minutes on 5 or more days each week.  Do not use any products that contain nicotine or tobacco, such as cigarettes, e-cigarettes, and chewing tobacco. If you need help quitting, ask your health care provider.  If you are sexually active, practice safe sex. Use a condom or other form of birth control (contraception) in order to prevent pregnancy and STIs (sexually transmitted infections). If you plan to become pregnant, see your health care provider for a preconception visit. What's next?  Visit your health care provider once a year for a well check visit.  Ask your health care provider how often you should have your eyes and teeth checked.  Stay up to date on all vaccines. This information is not intended to replace advice given to you by your health care provider. Make sure you discuss any questions you have with your health care provider. Document Released: 03/23/2001 Document Revised: 10/06/2017 Document Reviewed: 10/06/2017 Elsevier Patient Education  2020 [employer].

## 2018-12-29 NOTE — Progress Notes (Signed)
Subjective:    Patient ID: Colleen Woods, female    DOB: [birth year], 23 y.o.   MRN: [id number]  HPI   Patient presents today for complete physical.  Immunizations: declines flu shot, unsure of last tetanus Diet:  not eating healthy, drinks water, juice and soda.  Drinks  Exercise: not exercising Works 8-16 hrs/day.  Works 6-7 days a week.  Pap Smear: 10/11/16 Vision: up to date Dental: up to date  Wt Readings from Last 3 Encounters:  12/29/18 294 lb (133.4 kg)  10/24/18 298 lb (135.2 kg)  08/20/18 300 lb (136.1 kg)    Review of Systems  Constitutional: Negative for unexpected weight change.  HENT: Negative for hearing loss.   Eyes: Negative for visual disturbance.  Respiratory: Negative for cough and shortness of breath.   Cardiovascular: Negative for leg swelling.  Gastrointestinal: Negative for blood in stool, constipation and diarrhea.  Endocrine: Negative for polydipsia.  Genitourinary: Positive for frequency. Negative for dysuria, hematuria and menstrual problem.  Musculoskeletal: Negative for arthralgias and myalgias.  Skin: Negative for rash.  Neurological: Positive for headaches (occasional headaches).  Hematological: Negative for adenopathy.  Psychiatric/Behavioral:       Denies depression/anxiety   Past Medical History:  Diagnosis Date  . Migraines   . Recurrent boils      Social History   Socioeconomic History  . Marital status: Single    Spouse name: Not on file  . Number of children: Not on file  . Years of education: Not on file  . Highest education level: Not on file  Occupational History  . 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 Financial resource strain: Not hard at all  . Food insecurity    Worry: Never true    Inability: Never true  . Transportation needs    Medical: No    Non-medical: No  Tobacco Use  . Smoking status: Current Every Day Smoker    Packs/day: 0.10    Types: Cigarettes, Cigars  . Smokeless  tobacco: Never Used  Substance and Sexual Activity  . Alcohol use: Not Currently  . Drug use: Not Currently  . Sexual activity: Yes    Partners: Female    Birth control/protection: None  Lifestyle  . Physical activity    Days per week: 0 days    Minutes per session: Not on file  . Stress: Not on file  Relationships  . Social connections    Talks on phone: More than three times a week    Gets together: Never    Attends religious service: Never    Active member of club or organization: No    Attends meetings of clubs or organizations: Never    Relationship status: Not on file  . Intimate partner violence    Fear of current or ex partner: No    Emotionally abused: No    Physically abused: No    Forced sexual activity: No  Other Topics Concern  . Not on file  Social History Narrative   Moved from [facility] (moved here in 2019)   Works at [employer] (group home facility)   Step mom and younger brother/sister are all local   No pets   Enjoys sleeping, doing hair   Completed high school    Past Surgical History:  Procedure Laterality Date  . incision and drainage     of recurrent boils    Family History  Problem Relation Age of Onset  .  Hyperlipidemia Mother   . Hyperlipidemia Maternal Grandmother     No Known Allergies  Current Outpatient Medications on File Prior to Visit  Medication Sig Dispense Refill  . meloxicam (MOBIC) 7.5 MG tablet Take 1 tablet (7.5 mg total) by mouth daily. For back pain 14 tablet 0  . nortriptyline (PAMELOR) 10 MG capsule Take 1 capsule (10 mg total) by mouth at bedtime. 30 capsule 2  . SUMAtriptan (IMITREX) 50 MG tablet Take 1 tablet (50 mg total) by mouth every 2 (two) hours as needed for migraine (max 2 tabs/24 hrs). May repeat in 2 hours if headache persists or recurs. 10 tablet 2   No current facility-administered medications on file prior to visit.     BP 118/68 (BP Location: Left Arm, Patient Position: Sitting, Cuff Size: Large)    Pulse 89   Temp (!) 97.2 F (36.2 C) (Temporal)   Resp 16   Ht 6\' 1"  (1.854 m)   Wt 294 lb (133.4 kg)   SpO2 99%   BMI 38.79 kg/m       Objective:   Physical Exam  Physical Exam  Constitutional: She is oriented to person, place, and time. She appears well-developed and well-nourished. No distress.  HENT:  Head: Normocephalic and atraumatic.  Right Ear: Tympanic membrane and ear canal normal.  Left Ear: Tympanic membrane and ear canal normal.  Mouth/Throat: Oropharynx is clear and moist.  Eyes: Pupils are equal, round, and reactive to light. No scleral icterus.  Neck: Normal range of motion. No thyromegaly present.  Cardiovascular: Normal rate and regular rhythm.   No murmur heard. Pulmonary/Chest: Effort normal and breath sounds normal. No respiratory distress. He has no wheezes. She has no rales. She exhibits no tenderness.  Abdominal: Soft. Bowel sounds are normal. She exhibits no distension and no mass. There is no tenderness. There is no rebound and no guarding.  Musculoskeletal: She exhibits no edema.  Lymphadenopathy:    She has no cervical adenopathy.  Neurological: She is alert and oriented to person, place, and time. She has normal patellar reflexes. She exhibits normal muscle tone. Coordination normal.  Skin: Skin is warm and dry.  Psychiatric: She has a normal mood and affect. Her behavior is normal. Judgment and thought content normal.  Breast/pelvic: deferred           Assessment & Plan:   Preventative care- discussed healthy diet, exercise and weight loss. Refer to GYN for pap smear.  Immunizations reviewed and up to date. Declines flu shot.           Assessment & Plan:

## 2018-12-30 LAB — BASIC METABOLIC PANEL
BUN: 10 mg/dL (ref 7–25)
CO2: 23 mmol/L (ref 20–32)
Calcium: 9.2 mg/dL (ref 8.6–10.2)
Chloride: 106 mmol/L (ref 98–110)
Creat: 0.73 mg/dL (ref 0.50–1.10)
Glucose, Bld: 95 mg/dL (ref 65–99)
Potassium: 3.8 mmol/L (ref 3.5–5.3)
Sodium: 140 mmol/L (ref 135–146)

## 2018-12-30 LAB — HEPATIC FUNCTION PANEL
AG Ratio: 1.4 (calc) (ref 1.0–2.5)
ALT: 11 U/L (ref 6–29)
AST: 12 U/L (ref 10–30)
Albumin: 4.2 g/dL (ref 3.6–5.1)
Alkaline phosphatase (APISO): 43 U/L (ref 31–125)
Bilirubin, Direct: 0.1 mg/dL (ref 0.0–0.2)
Globulin: 3 g/dL (calc) (ref 1.9–3.7)
Indirect Bilirubin: 0.2 mg/dL (calc) (ref 0.2–1.2)
Total Bilirubin: 0.3 mg/dL (ref 0.2–1.2)
Total Protein: 7.2 g/dL (ref 6.1–8.1)

## 2018-12-30 LAB — CBC WITH DIFFERENTIAL/PLATELET
Absolute Monocytes: 495 cells/uL (ref 200–950)
Basophils Absolute: 31 cells/uL (ref 0–200)
Basophils Relative: 0.6 %
Eosinophils Absolute: 31 cells/uL (ref 15–500)
Eosinophils Relative: 0.6 %
HCT: 38.2 % (ref 35.0–45.0)
Hemoglobin: 12.2 g/dL (ref 11.7–15.5)
Lymphs Abs: 2152 cells/uL (ref 850–3900)
MCH: 27.2 pg (ref 27.0–33.0)
MCHC: 31.9 g/dL — ABNORMAL LOW (ref 32.0–36.0)
MCV: 85.3 fL (ref 80.0–100.0)
MPV: 12.3 fL (ref 7.5–12.5)
Monocytes Relative: 9.7 %
Neutro Abs: 2392 cells/uL (ref 1500–7800)
Neutrophils Relative %: 46.9 %
Platelets: 232 10*3/uL (ref 140–400)
RBC: 4.48 10*6/uL (ref 3.80–5.10)
RDW: 13 % (ref 11.0–15.0)
Total Lymphocyte: 42.2 %
WBC: 5.1 10*3/uL (ref 3.8–10.8)

## 2018-12-30 LAB — LIPID PANEL
Cholesterol: 139 mg/dL (ref <200)
HDL: 39 mg/dL — ABNORMAL LOW (ref >=50)
LDL Cholesterol (Calc): 85 mg/dL (calc)
Non-HDL Cholesterol (Calc): 100 mg/dL (calc) (ref <130)
Total CHOL/HDL Ratio: 3.6 (calc) (ref <5.0)
Triglycerides: 68 mg/dL (ref <150)

## 2018-12-30 LAB — TSH: TSH: 0.76 mIU/L

## 2019-01-24 ENCOUNTER — Encounter: Payer: Self-pay | Admitting: Family

## 2019-01-30 ENCOUNTER — Institutional Professional Consult (permissible substitution): Payer: BC Managed Care – PPO | Admitting: Plastic Surgery

## 2019-06-29 ENCOUNTER — Ambulatory Visit: Payer: Self-pay | Admitting: Family

## 2019-06-29 DIAGNOSIS — Z0289 Encounter for other administrative examinations: Secondary | ICD-10-CM

## 2021-03-07 IMAGING — CR CHEST - 2 VIEW
2 series · 2 of 2 positions shown · non-contrast
Comparison: None.

CLINICAL DATA: Cough and congestion for 3 days.

EXAM:
CHEST - 2 VIEW

[w chest pa]
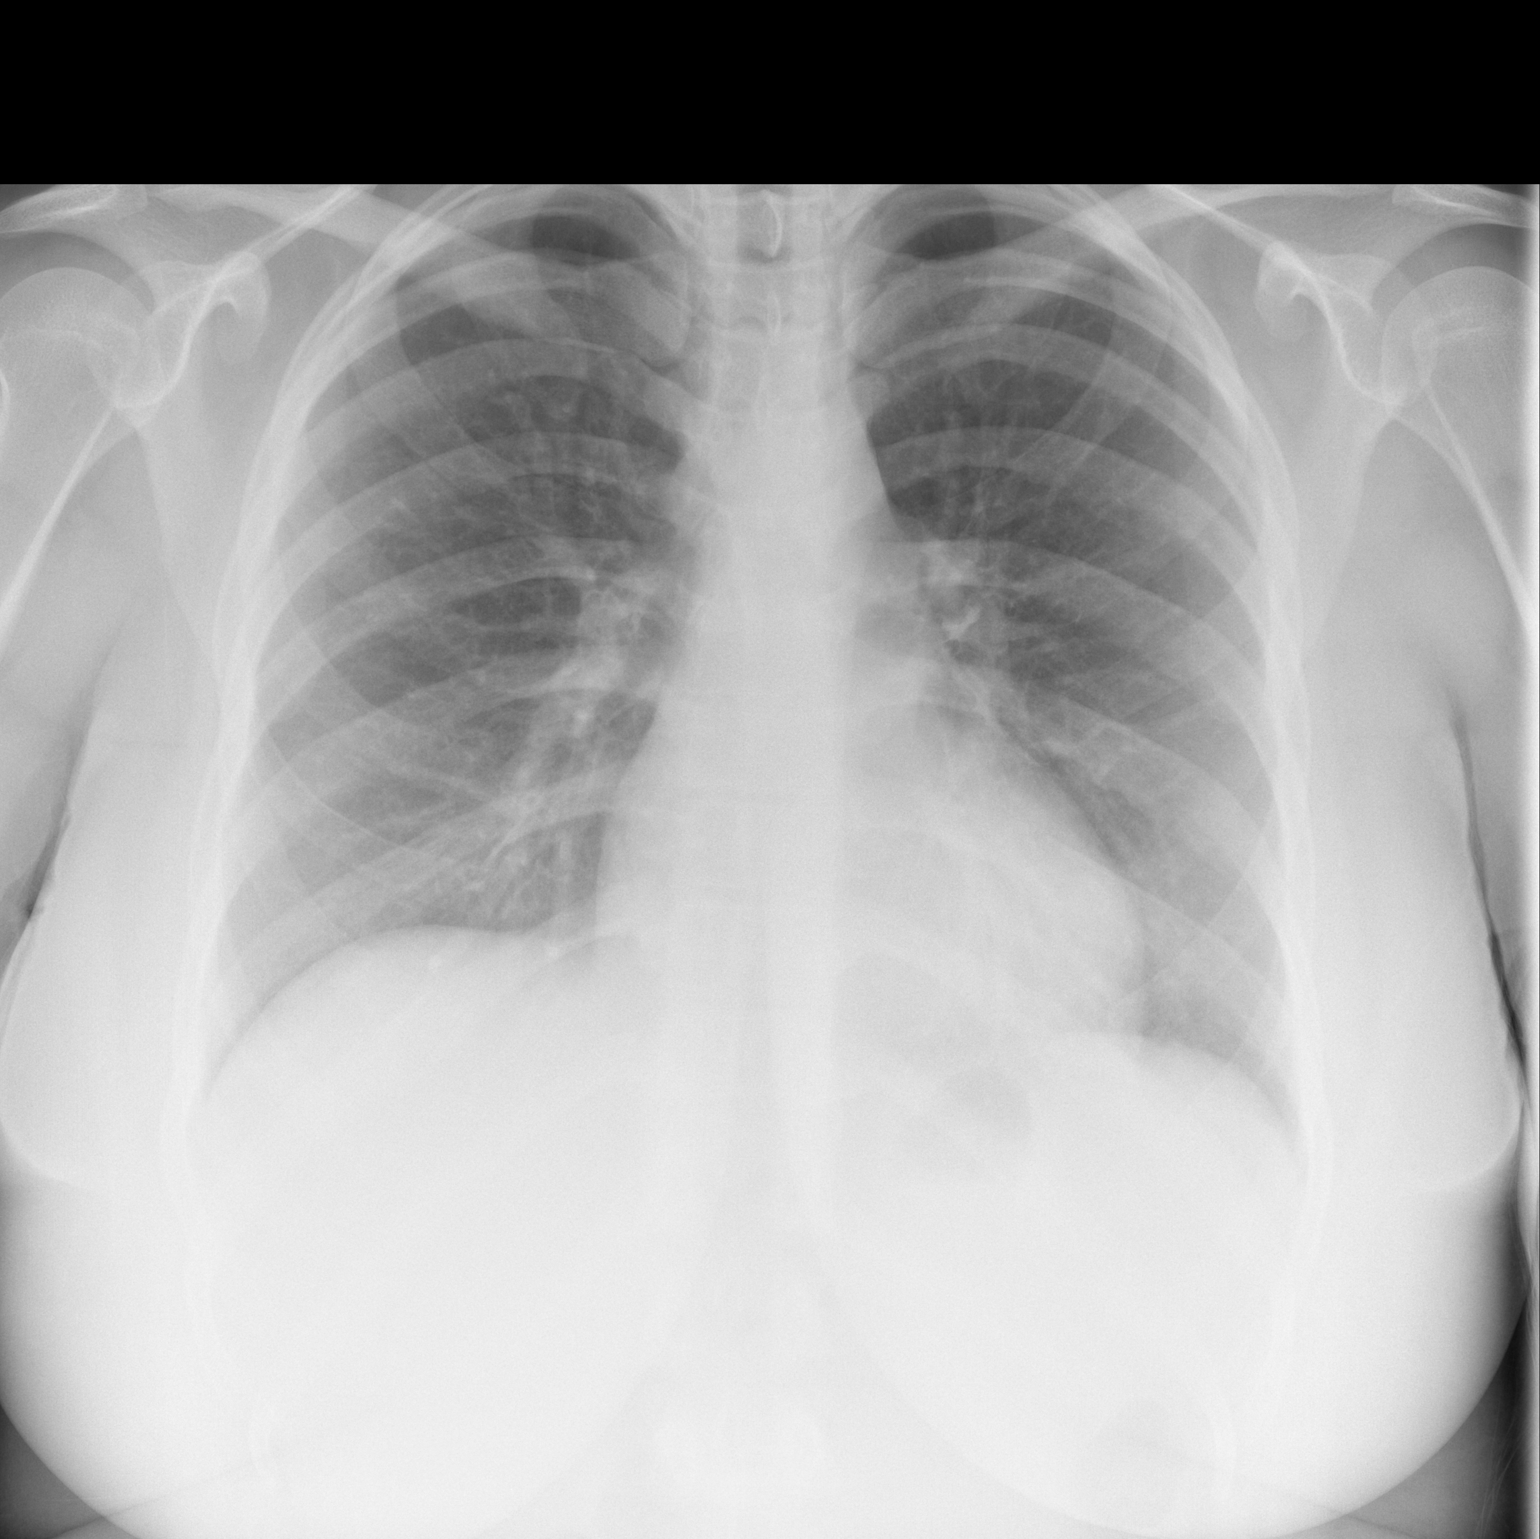

[w chest lat]
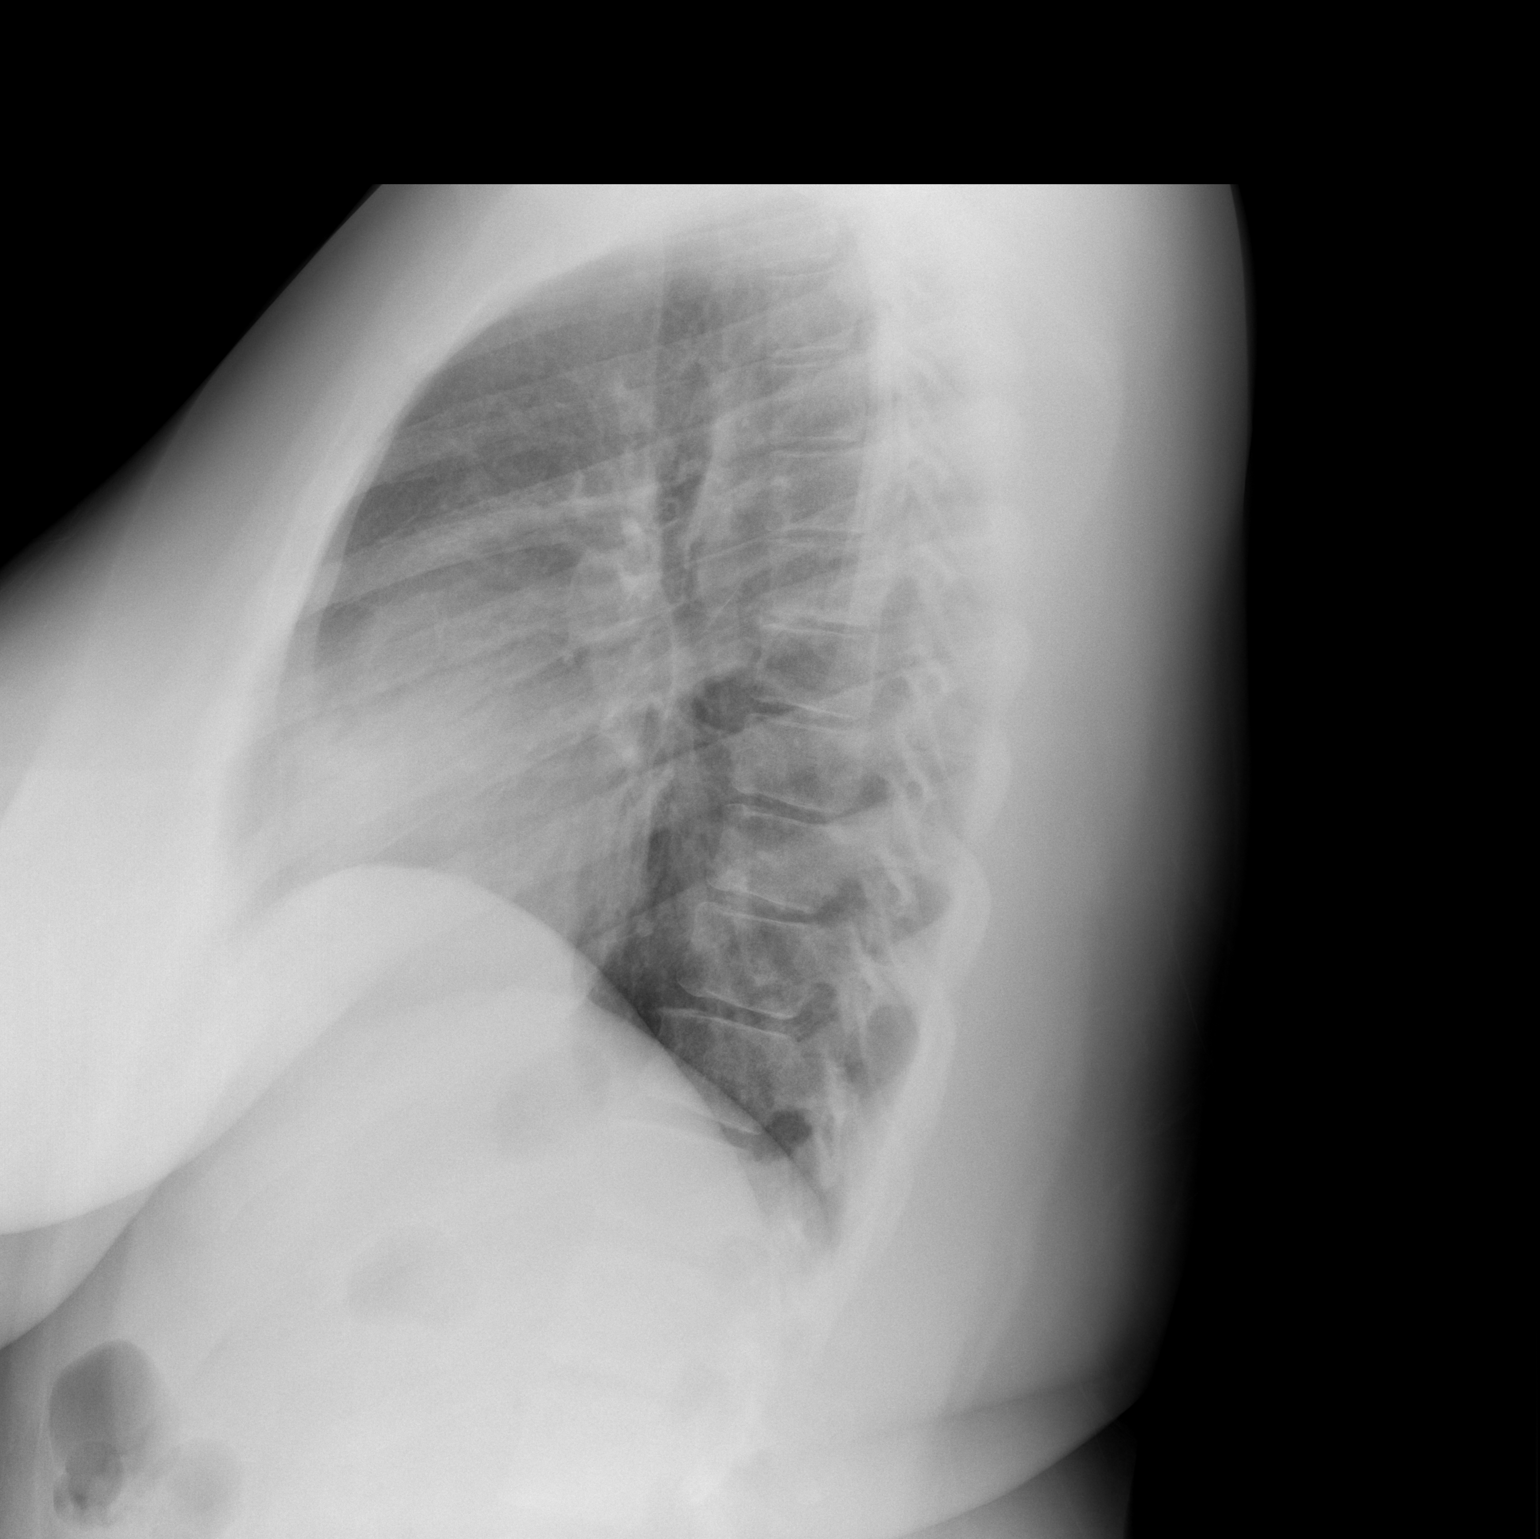

[2 of 2 positions shown; findings below may reference images not displayed]

FINDINGS: Lungs clear. Heart size normal. No pneumothorax or pleural fluid. No
acute or focal bony abnormality.
IMPRESSION: Negative chest.
